# Patient Record
Sex: Male | Born: 1985 | Race: White | Hispanic: No | Marital: Single | Smoking: Current every day smoker
Health system: Southern US, Community
[De-identification: ages and names within clinical notes are randomized; demographics above are authoritative.]

---
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
## 2008-10-15 ENCOUNTER — Emergency Department: Payer: Self-pay | Admitting: Emergency Medicine

## 2010-03-29 IMAGING — CR DG CHEST 2V
1 series · 2 of 2 positions shown · non-contrast
Comparison: none

REASON FOR EXAM: cough fever
COMMENTS:   LMP: male

PROCEDURE:     DXR - DXR CHEST PA (OR AP) AND LATERAL  - October 15, 2008 [DATE]
RESULT:     Comparison: No comparison

[Series 1: view not recorded · 0.17mm/px · 2 of 2 slices shown]
[im 1/2]
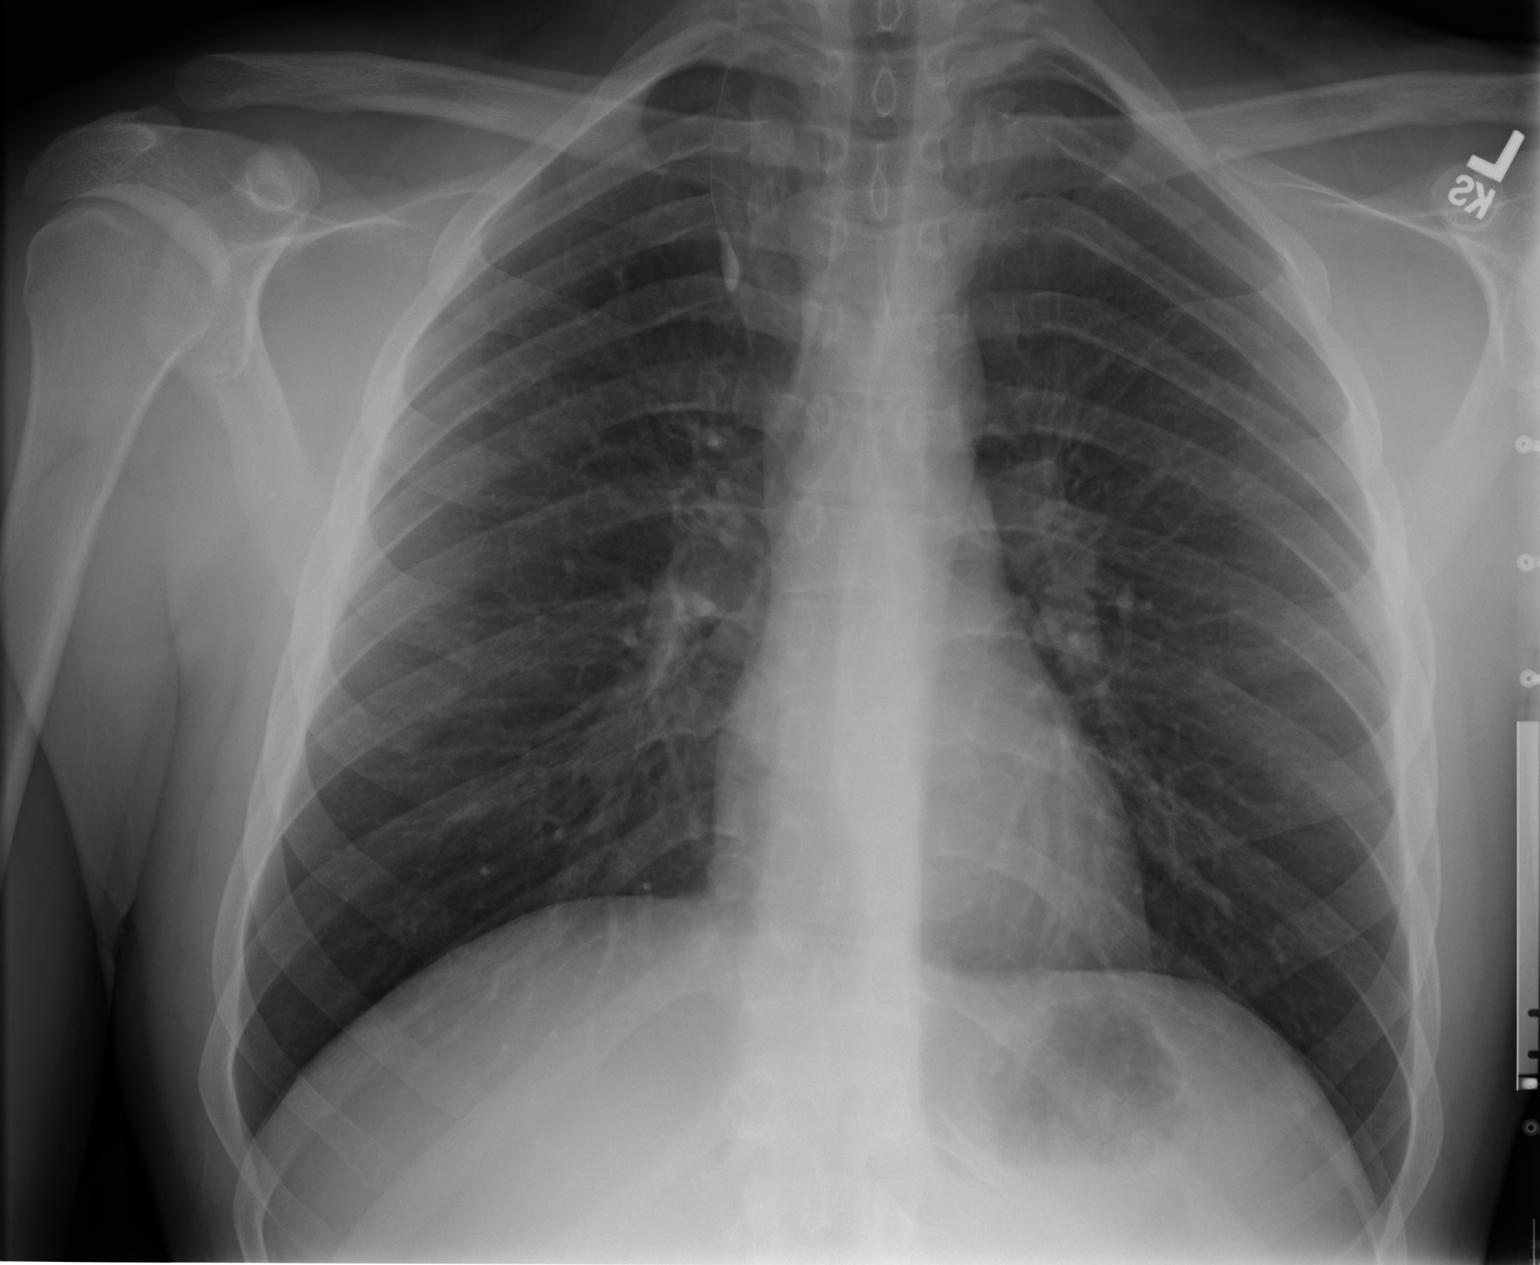
[im 2/2]
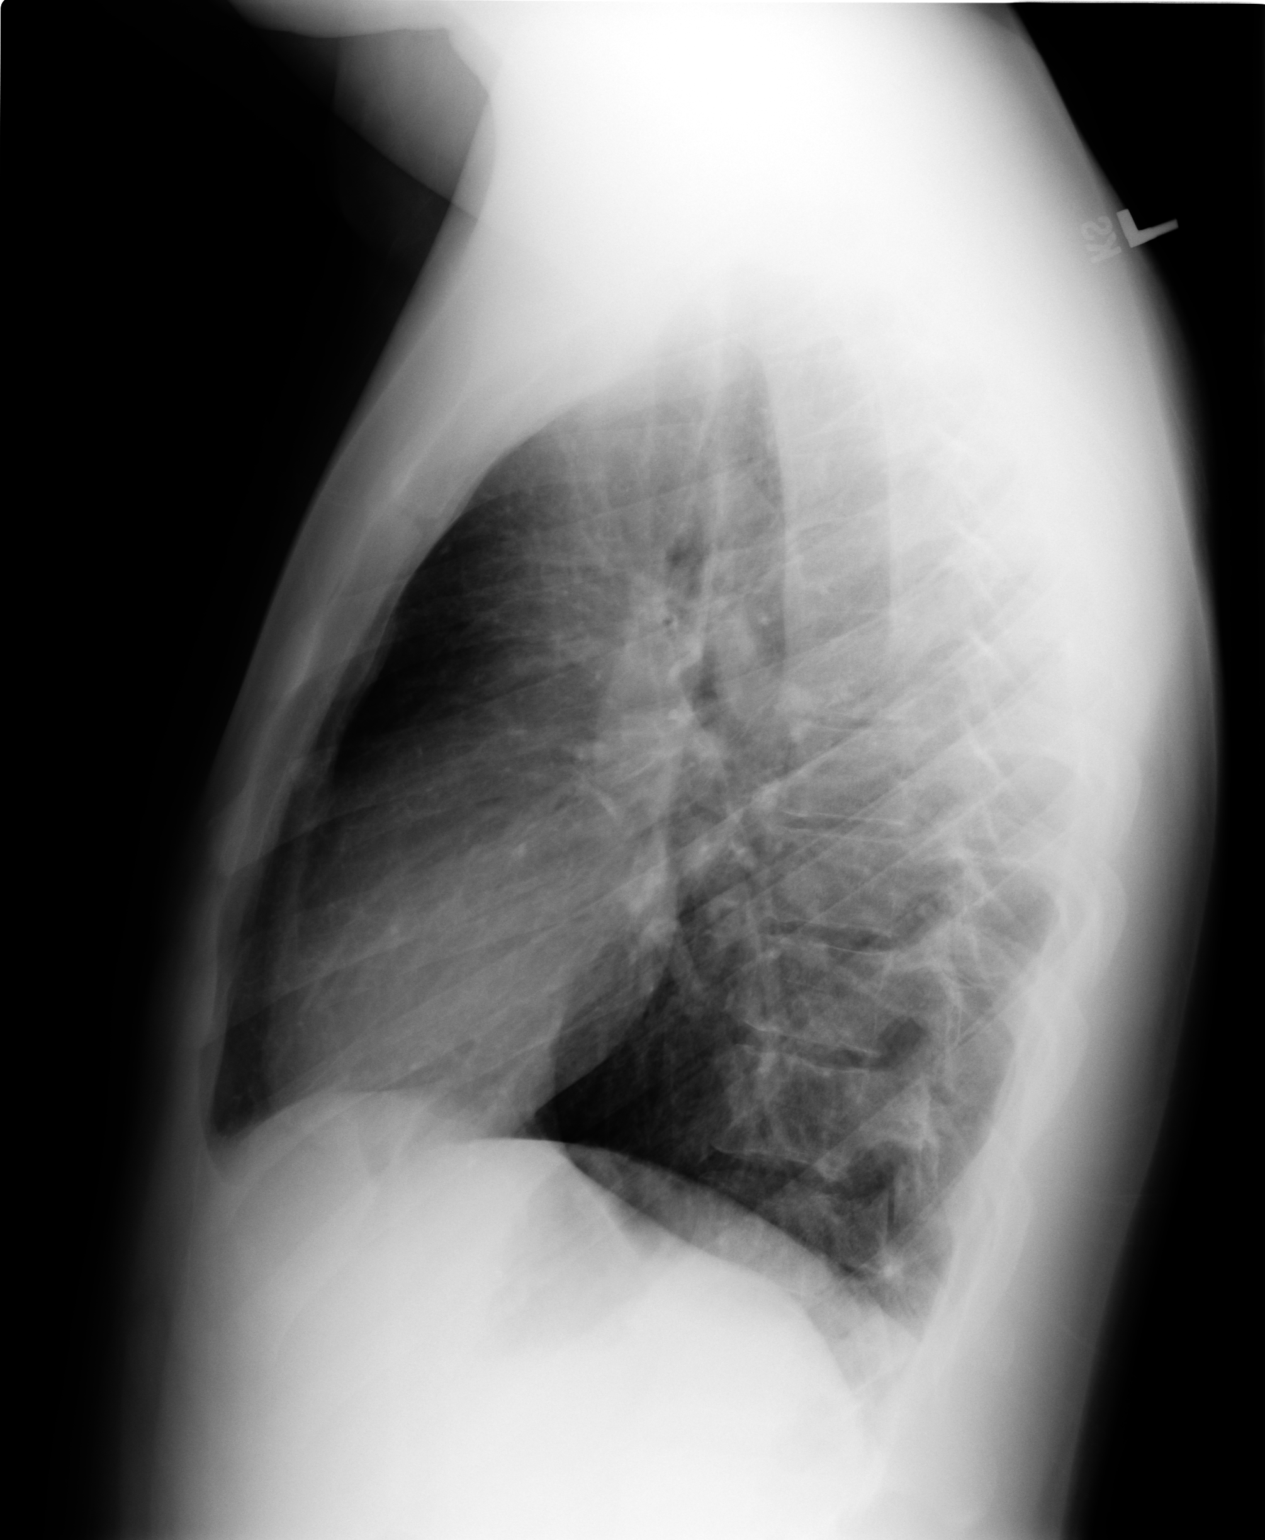

[2 of 2 positions shown; findings below may reference images not displayed]

FINDINGS: PA and lateral chest radiographs are provided. There is no focal parenchymal
opacity, pleural effusion, or pneumothorax. Incidental note is made of an
azygos fissure. The heart and mediastinum are unremarkable. The osseous
structures are unremarkable.
IMPRESSION: No acute disease of the chest.

## 2017-10-05 ENCOUNTER — Emergency Department
Admission: EM | Admit: 2017-10-05 | Discharge: 2017-10-05 | Disposition: A | Discharge status: Home / Self Care | Payer: Self-pay | Attending: Emergency Medicine | Admitting: Emergency Medicine

## 2017-10-05 ENCOUNTER — Other Ambulatory Visit: Payer: Self-pay

## 2017-10-05 DIAGNOSIS — T401X1A Poisoning by heroin, accidental (unintentional), initial encounter: Secondary | ICD-10-CM | POA: Insufficient documentation

## 2017-10-05 MED ORDER — NALOXONE HCL 4 MG/0.1ML NA LIQD: 1.0000 | Freq: Once | NASAL | 0 refills | Status: AC

## 2017-10-05 NOTE — ED Notes (Signed)
topez not working 

## 2017-10-05 NOTE — ED Provider Notes (Signed)
[facility] Emergency Department Provider Note ____________________________________________   I have reviewed the triage vital signs and the triage nursing note.  HISTORY  Chief Complaint Drug Overdose   Historian Patient  HPI Jeremy Fitzgerald is a 31 y.o. male presents by EMS after Narcan given for decreased responsiveness after reported overdose.  Patient is alert and able to give his history now.  He states that he typically uses cocaine, but his dealer did not have any cocaine, but did have heroin.  He states he does not typically do heroin.  States the friend that was with him had access to his injectable naloxone, but did not want to give an injection.  Patient reports history of prior drug abuse and prior drug treatment program residential, and states that he just got a little only made that decision.  He states that he has the contacts from the previous program.  He is not reporting acute worsening depression or suicidal ideation.  States this was an accidental overdose.   No past medical history on file.  There are no active problems to display for this patient.     Prior to Admission medications   Medication Sig Start Date End Date Taking? Authorizing Provider  naloxone [facility](NARCAN) nasal spray 4 mg/0.1 mL Place 1 spray into the nose once for 1 dose. 10/05/17 10/05/17  Governor RooksLord, Sinthia Karabin, MD    Not on File  No family history on file.  Social History Social History   Tobacco Use  . Smoking status: Not on file  Substance Use Topics  . Alcohol use: Not on file  . Drug use: Not on file    Review of Systems  Constitutional: Negative for recent illness. Eyes: Negative for visual changes. ENT: Negative for sore throat. Cardiovascular: Negative for chest pain. Respiratory: Negative for shortness of breath. Gastrointestinal: Negative for abdominal pain, vomiting and diarrhea. Genitourinary: Negative for dysuria. Musculoskeletal: Negative for back  pain. Skin: Negative for rash. Neurological: Negative for headache.  ____________________________________________   PHYSICAL EXAM:  VITAL SIGNS: ED Triage Vitals  Enc Vitals Group     BP 10/05/17 1214 126/82     Pulse Rate 10/05/17 1214 (!) 122     Resp 10/05/17 1214 16     Temp 10/05/17 1214 98.5 F (36.9 C)     Temp Source 10/05/17 1214 Oral     SpO2 10/05/17 1214 96 %     Weight 10/05/17 1215 180 lb (81.6 kg)     Height 10/05/17 1215 5\' 11"  (1.803 m)     Head Circumference --      Peak Flow --      Pain Score 10/05/17 1418 5     Pain Loc --      Pain Edu? --      Excl. in GC? --      Constitutional: Alert and oriented. Well appearing and in no distress. HEENT   Head: Normocephalic and atraumatic.      Eyes: Conjunctivae are normal. Pupils equal and round.       Ears:         Nose: No congestion/rhinnorhea.   Mouth/Throat: Mucous membranes are moist.   Neck: No stridor. Cardiovascular/Chest: Normal rate, regular rhythm.  No murmurs, rubs, or gallops. Respiratory: Normal respiratory effort without tachypnea nor retractions. Breath sounds are clear and equal bilaterally. No wheezes/rales/rhonchi. Gastrointestinal: Soft. No distention, no guarding, no rebound. Nontender.    Genitourinary/rectal:Deferred Musculoskeletal: Nontender with normal range of motion in all extremities. No  joint effusions.  No lower extremity tenderness.  No edema. Neurologic:  Normal speech and language. No gross or focal neurologic deficits are appreciated. Skin:  Skin is warm, dry and intact. No rash noted. Psychiatric: Mood and affect are normal. Speech and behavior are normal. Patient exhibits appropriate insight and judgment.   ____________________________________________  LABS (pertinent positives/negatives) I, Governor Rooksebecca Malli Falotico, MD the attending physician have reviewed the labs noted below.  Labs Reviewed - No data to display  ____________________________________________     EKG I, Governor Rooksebecca Leidy Massar, MD, the attending physician have personally viewed and interpreted all ECGs.  122 bpm.  Sinus tachycardia.  Narrow QS.  Normal axis.  Normal ST and T wave ____________________________________________  RADIOLOGY All Xrays were viewed by me.  Imaging interpreted by Radiologist, and I, Governor Rooksebecca Alessa Mazur, MD the attending physician have reviewed the radiologist interpretation noted below.  None __________________________________________  PROCEDURES  Procedure(s) performed: None  Critical Care performed: None   ____________________________________________  ED COURSE / ASSESSMENT AND PLAN  Pertinent labs & imaging results that were available during my care of the patient were reviewed by me and considered in my medical decision making (see chart for details).    Patient is awake and alert and understands the severity of heroin overdose followed by Narcan reversal.  After observation.  Here, no recurrent somnolence.  We discussed to be with someone today although I suspect that Narcan is now metabolized.  Patient is not depressed or suicidal, and states that he is not interested in drug detox.  He has resources from prior residential drug treatment program as well as contacts for that.  He has access to injectable naloxone and I am going to write him a prescription for intranasal naloxone, because his friend was hesitant to give the injectable.      CONSULTATIONS:   None   Patient / Family / Caregiver informed of clinical course, medical decision-making process, and agree with plan.   I discussed return precautions, follow-up instructions, and discharge instructions with patient and/or family.  Discharge Instructions : You are evaluated for heroin overdose.  Return to emergency department immediately for any worsening condition including any depression or suicidal thoughts, altered mental status or decreased breathing.  As we discussed, make sure whoever  you are with has no problem giving you the injectable Narcan if you are not responding appropriately, or alternatively fill prescription for nasal spray.    ___________________________________________   FINAL CLINICAL IMPRESSION(S) / ED DIAGNOSES   Final diagnoses:  Accidental overdose of heroin, initial encounter (HCC)      ___________________________________________        Note: This dictation was prepared with Dragon dictation. Any transcriptional errors that result from this process are unintentional    Governor RooksLord, Tyon Cerasoli, MD 10/05/17 1504

## 2017-10-05 NOTE — Discharge Instructions (Signed)
You are evaluated for heroin overdose.  Return to emergency department immediately for any worsening condition including any depression or suicidal thoughts, altered mental status or decreased breathing.  As we discussed, make sure whoever you are with has no problem giving you the injectable Narcan if you are not responding appropriately, or alternatively fill prescription for nasal spray.

## 2017-10-05 NOTE — ED Triage Notes (Signed)
Pt arrived via EMS from a friends house d/t drug overdose witnessed by friend. EMS reports pt never stopped breathing completely; the lowest respiration rate recorded was 4. Pt was given 1mg  of narcan via nasal. Pt is A&O x4 at this time with stable vs.

## 2020-07-12 ENCOUNTER — Encounter: Payer: Self-pay | Admitting: *Deleted

## 2020-07-12 ENCOUNTER — Other Ambulatory Visit: Payer: Self-pay

## 2020-07-12 ENCOUNTER — Emergency Department
Admission: EM | Admit: 2020-07-12 | Discharge: 2020-07-12 | Disposition: A | Discharge status: Home / Self Care | Payer: Self-pay | Attending: Emergency Medicine | Admitting: Emergency Medicine

## 2020-07-12 DIAGNOSIS — R519 Headache, unspecified: Secondary | ICD-10-CM | POA: Insufficient documentation

## 2020-07-12 DIAGNOSIS — R509 Fever, unspecified: Secondary | ICD-10-CM | POA: Insufficient documentation

## 2020-07-12 DIAGNOSIS — Z5321 Procedure and treatment not carried out due to patient leaving prior to being seen by health care provider: Secondary | ICD-10-CM | POA: Insufficient documentation

## 2020-07-12 NOTE — ED Notes (Signed)
Pt left and went to [facility] for eval

## 2020-07-12 NOTE — ED Triage Notes (Addendum)
Pt to triage via wheelchair.  Pt reports a fever, headache.  Taking tylenol with some relief.  No n/v/    Pt was sent from [facility] for eval.   Pt alert speech clear.

## 2024-01-07 ENCOUNTER — Emergency Department: Payer: Self-pay

## 2024-01-07 ENCOUNTER — Other Ambulatory Visit: Payer: Self-pay

## 2024-01-07 ENCOUNTER — Emergency Department
Admission: EM | Admit: 2024-01-07 | Discharge: 2024-01-07 | Disposition: A | Discharge status: Home / Self Care | Payer: Self-pay | Attending: Emergency Medicine | Admitting: Emergency Medicine

## 2024-01-07 DIAGNOSIS — S52611A Displaced fracture of right ulna styloid process, initial encounter for closed fracture: Secondary | ICD-10-CM | POA: Insufficient documentation

## 2024-01-07 DIAGNOSIS — S52501A Unspecified fracture of the lower end of right radius, initial encounter for closed fracture: Secondary | ICD-10-CM | POA: Insufficient documentation

## 2024-01-07 DIAGNOSIS — Y92410 Unspecified street and highway as the place of occurrence of the external cause: Secondary | ICD-10-CM | POA: Insufficient documentation

## 2024-01-07 MED ORDER — OXYCODONE-ACETAMINOPHEN 5-325 MG PO TABS: 1.0000 | ORAL_TABLET | ORAL | 0 refills | Status: AC | PRN

## 2024-01-07 MED ORDER — ONDANSETRON 4 MG PO TBDP: 4.0000 mg | ORAL_TABLET | Freq: Three times a day (TID) | ORAL | 0 refills | Status: AC | PRN

## 2024-01-07 MED ORDER — ONDANSETRON 4 MG PO TBDP
4.0000 mg | ORAL_TABLET | Freq: Once | ORAL | Status: AC
  Administered 2024-01-07: 4 mg via ORAL
  Filled 2024-01-07: qty 1

## 2024-01-07 MED ORDER — OXYCODONE-ACETAMINOPHEN 5-325 MG PO TABS
1.0000 | ORAL_TABLET | Freq: Once | ORAL | Status: AC
  Administered 2024-01-07: 1 via ORAL
  Filled 2024-01-07: qty 1

## 2024-01-07 NOTE — ED Triage Notes (Signed)
 Pt to ED via POV c/o wrist injury. Pt slipped, fell, and tried to catch himself with right hand. Landed on his wrist. Obvious deformity and swelling to right wrist.

## 2024-01-07 NOTE — ED Provider Notes (Signed)
 [facility] Provider Note    Event Date/Time   First MD Initiated Contact with Patient 01/07/24 2252     (approximate)  History   Chief Complaint: Wrist Pain  HPI  Jeremy Fitzgerald is a 38 y.o. male who presents to the emergency department for a right wrist injury.  According to the patient he was getting out of his work truck when he somewhat falling onto the ground.  Attempted to catch himself with his right wrist/hand and immediately had pain to the right wrist.  Denies hitting his head.  No LOC, no other injuries.  Physical Exam   Triage Vital Signs: ED Triage Vitals  Encounter Vitals Group     BP 01/07/24 2042 (!) 141/78     Systolic BP Percentile --      Diastolic BP Percentile --      Pulse Rate 01/07/24 2042 75     Resp 01/07/24 2042 18     Temp 01/07/24 2042 98 F (36.7 C)     Temp src --      SpO2 01/07/24 2042 98 %     Weight 01/07/24 2039 220 lb (99.8 kg)     Height 01/07/24 2039 6\' 2"  (1.88 m)     Head Circumference --      Peak Flow --      Pain Score 01/07/24 2039 10     Pain Loc --      Pain Education --      Exclude from Growth Chart --     Most recent vital signs: Vitals:   01/07/24 2042  BP: (!) 141/78  Pulse: 75  Resp: 18  Temp: 98 F (36.7 C)  SpO2: 98%    General: Awake, no distress.  CV:  Good peripheral perfusion.  Resp:  Normal effort. Other:  Moderate swelling to the right wrist and dorsal aspect of the hand.  Neurovascularly intact distally.   ED Results / Procedures / Treatments   RADIOLOGY  I have reviewed and interpreted the x-ray images patient peers to have ulnar styloid as well as a distal radius fracture.  Minimal to no angulation. Radiology has read the x-rays comminuted intra-articular distal right radial fracture mild impaction.  Ulnar styloid fracture.  Minimal triquetrium fracture   MEDICATIONS ORDERED IN ED: Medications  oxyCODONE-acetaminophen (PERCOCET/ROXICET) 5-325 MG per tablet 1 tablet  (has no administration in time range)  ondansetron (ZOFRAN-ODT) disintegrating tablet 4 mg (has no administration in time range)     IMPRESSION / MDM / ASSESSMENT AND PLAN / ED COURSE  I reviewed the triage vital signs and the nursing notes.  Patient's presentation is most consistent with acute presentation with potential threat to life or bodily function.  Patient presents emergency department for right wrist pain after falling catching himself on an outstretched hand.  X-ray shows ulnar styloid fracture distal radius fracture as well as a triquetral fracture.  No other injuries on examination.  Neurovascularly intact distally.  I spoke to Dr.Poggi of orthopedics who has reviewed the films.  Given no angulation we can splint in place with a sugar-tong splint and a sling.  Patient will follow-up with the office in 1 week for likely cast.  Will discharge with pain medication.  Patient agreeable to plan of care.  FINAL CLINICAL IMPRESSION(S) / ED DIAGNOSES   Distal radius fracture Ulnar styloid fracture   Note:  This document was prepared using Dragon voice recognition software and may include unintentional dictation errors.  Minna Antis, MD 01/07/24 2259

## 2024-01-07 NOTE — Discharge Instructions (Signed)
 Please wear your splint and sling.  You may take your pain medication as needed but only as prescribed.  Do not drink alcohol or drive while taking this medication.  Please call the number provided for orthopedics to arrange a follow-up appointment in 1 week.  Return to the emergency department for any worsening pain or any other symptom personally concerning to yourself.
